# Patient Record
Sex: Male | Born: 1975 | Race: Black or African American | Hispanic: No | Marital: Single | Smoking: Never smoker
Health system: Southern US, Community
[De-identification: ages and names within clinical notes are randomized; demographics above are authoritative.]

---

## 2003-06-21 ENCOUNTER — Emergency Department (HOSPITAL_COMMUNITY): Admission: EM | Admit: 2003-06-21 | Discharge: 2003-06-21 | Payer: Self-pay | Admitting: Emergency Medicine

## 2007-02-15 ENCOUNTER — Encounter (INDEPENDENT_AMBULATORY_CARE_PROVIDER_SITE_OTHER): Payer: Self-pay | Admitting: *Deleted

## 2007-02-25 ENCOUNTER — Emergency Department (HOSPITAL_COMMUNITY): Admission: EM | Admit: 2007-02-25 | Discharge: 2007-02-25 | Payer: Self-pay | Admitting: Emergency Medicine

## 2007-02-27 ENCOUNTER — Emergency Department (HOSPITAL_COMMUNITY): Admission: EM | Admit: 2007-02-27 | Discharge: 2007-02-27 | Payer: Self-pay | Admitting: Emergency Medicine

## 2008-04-01 ENCOUNTER — Emergency Department (HOSPITAL_COMMUNITY): Admission: EM | Admit: 2008-04-01 | Discharge: 2008-04-01 | Payer: Self-pay | Admitting: Family Medicine

## 2010-04-25 ENCOUNTER — Telehealth: Payer: Self-pay | Admitting: Internal Medicine

## 2010-10-21 NOTE — Progress Notes (Signed)
Summary: Advised to go to ER  Phone Note Call from Patient   Summary of Call: Patient fiance called stating that the patient has been having high BPwith bystolic as high as 190. Pt has also c/o chest pain and limb/foot numbness. This began yesterday and the patient did not see a doctor, patient returned to work today stating that he felt only somewhat better. Fiance was advised to take patient to ER dept for cardiac work up. Initial call taken by: Lucious Groves CMA,  April 25, 2010 11:00 AM  Follow-up for Phone Call        agree Follow-up by: [facility] E. Paz MD,  April 25, 2010 3:30 PM

## 2010-12-13 ENCOUNTER — Emergency Department (INDEPENDENT_AMBULATORY_CARE_PROVIDER_SITE_OTHER): Payer: No Typology Code available for payment source

## 2010-12-13 ENCOUNTER — Emergency Department (HOSPITAL_BASED_OUTPATIENT_CLINIC_OR_DEPARTMENT_OTHER)
Admission: EM | Admit: 2010-12-13 | Discharge: 2010-12-13 | Disposition: A | Discharge status: Home / Self Care | Payer: No Typology Code available for payment source | Attending: Emergency Medicine | Admitting: Emergency Medicine

## 2010-12-13 DIAGNOSIS — M79609 Pain in unspecified limb: Secondary | ICD-10-CM

## 2010-12-13 DIAGNOSIS — S9030XA Contusion of unspecified foot, initial encounter: Secondary | ICD-10-CM | POA: Insufficient documentation

## 2010-12-13 DIAGNOSIS — Y92410 Unspecified street and highway as the place of occurrence of the external cause: Secondary | ICD-10-CM | POA: Insufficient documentation

## 2010-12-13 DIAGNOSIS — S139XXA Sprain of joints and ligaments of unspecified parts of neck, initial encounter: Secondary | ICD-10-CM | POA: Insufficient documentation

## 2010-12-13 DIAGNOSIS — M542 Cervicalgia: Secondary | ICD-10-CM

## 2010-12-16 ENCOUNTER — Emergency Department (INDEPENDENT_AMBULATORY_CARE_PROVIDER_SITE_OTHER): Payer: No Typology Code available for payment source

## 2010-12-16 ENCOUNTER — Emergency Department (HOSPITAL_BASED_OUTPATIENT_CLINIC_OR_DEPARTMENT_OTHER)
Admission: EM | Admit: 2010-12-16 | Discharge: 2010-12-16 | Disposition: A | Discharge status: Home / Self Care | Payer: No Typology Code available for payment source | Attending: Emergency Medicine | Admitting: Emergency Medicine

## 2010-12-16 DIAGNOSIS — M545 Low back pain, unspecified: Secondary | ICD-10-CM

## 2010-12-16 DIAGNOSIS — M542 Cervicalgia: Secondary | ICD-10-CM | POA: Insufficient documentation

## 2010-12-16 DIAGNOSIS — M546 Pain in thoracic spine: Secondary | ICD-10-CM | POA: Insufficient documentation

## 2010-12-16 DIAGNOSIS — S335XXA Sprain of ligaments of lumbar spine, initial encounter: Secondary | ICD-10-CM | POA: Insufficient documentation

## 2010-12-16 DIAGNOSIS — Y92410 Unspecified street and highway as the place of occurrence of the external cause: Secondary | ICD-10-CM | POA: Insufficient documentation

## 2010-12-23 ENCOUNTER — Emergency Department (HOSPITAL_BASED_OUTPATIENT_CLINIC_OR_DEPARTMENT_OTHER)
Admission: EM | Admit: 2010-12-23 | Discharge: 2010-12-23 | Disposition: A | Discharge status: Home / Self Care | Payer: No Typology Code available for payment source | Attending: Emergency Medicine | Admitting: Emergency Medicine

## 2010-12-23 DIAGNOSIS — S335XXA Sprain of ligaments of lumbar spine, initial encounter: Secondary | ICD-10-CM | POA: Insufficient documentation

## 2011-07-05 ENCOUNTER — Inpatient Hospital Stay (INDEPENDENT_AMBULATORY_CARE_PROVIDER_SITE_OTHER)
Admission: RE | Admit: 2011-07-05 | Discharge: 2011-07-05 | Disposition: A | Discharge status: Home / Self Care | Payer: Self-pay | Source: Ambulatory Visit | Attending: Family Medicine | Admitting: Family Medicine

## 2011-07-05 DIAGNOSIS — T6391XA Toxic effect of contact with unspecified venomous animal, accidental (unintentional), initial encounter: Secondary | ICD-10-CM

## 2011-07-09 LAB — CULTURE, ROUTINE-ABSCESS
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
## 2011-12-13 ENCOUNTER — Encounter (HOSPITAL_COMMUNITY): Payer: Self-pay

## 2011-12-13 ENCOUNTER — Emergency Department (HOSPITAL_COMMUNITY)
Admission: EM | Admit: 2011-12-13 | Discharge: 2011-12-13 | Disposition: A | Discharge status: Home / Self Care | Payer: 59 | Attending: Emergency Medicine | Admitting: Emergency Medicine

## 2011-12-13 DIAGNOSIS — R5381 Other malaise: Secondary | ICD-10-CM | POA: Insufficient documentation

## 2011-12-13 DIAGNOSIS — R5383 Other fatigue: Secondary | ICD-10-CM

## 2011-12-13 DIAGNOSIS — R509 Fever, unspecified: Secondary | ICD-10-CM | POA: Insufficient documentation

## 2011-12-13 NOTE — ED Provider Notes (Signed)
Medical screening examination/treatment/procedure(s) were performed by non-physician practitioner and as supervising physician I was immediately available for consultation/collaboration.  Jadie Allington T Anibal Quinby, MD 12/13/11 2325 

## 2011-12-13 NOTE — ED Notes (Signed)
Patient here with 3 days of low grade fever and no other symptoms. Patient denies cold symptoms+

## 2011-12-13 NOTE — Discharge Instructions (Signed)
Your symptoms may be due to possible viral infection. Make sure you get good rest, good sleep, drink plenty of fluids. Take tomorrow off work. Find a primary care doctor, follow up with them in 2-3 days. If worsening, if develop high fever, new symptoms, return here and we will recheck you.   Fatigue Fatigue is a feeling of tiredness, lack of energy, lack of motivation, or feeling tired all the time. Having enough rest, good nutrition, and reducing stress will normally reduce fatigue. Consult your caregiver if it persists. The nature of your fatigue will help your caregiver to find out its cause. The treatment is based on the cause.  CAUSES  There are many causes for fatigue. Most of the time, fatigue can be traced to one or more of your habits or routines. Most causes fit into one or more of three general areas. They are: Lifestyle problems  Sleep disturbances.   Overwork.   Physical exertion.   Unhealthy habits.   Poor eating habits or eating disorders.   Alcohol and/or drug use .   Lack of proper nutrition (malnutrition).  Psychological problems  Stress and/or anxiety problems.   Depression.   Grief.   Boredom.  Medical Problems or Conditions  Anemia.   Pregnancy.   Thyroid gland problems.   Recovery from major surgery.   Continuous pain.   Emphysema or asthma that is not well controlled   Allergic conditions.   Diabetes.   Infections (such as mononucleosis).   Obesity.   Sleep disorders, such as sleep apnea.   Heart failure or other heart-related problems.   Cancer.   Kidney disease.   Liver disease.   Effects of certain medicines such as antihistamines, cough and cold remedies, prescription pain medicines, heart and blood pressure medicines, drugs used for treatment of cancer, and some antidepressants.  SYMPTOMS  The symptoms of fatigue include:   Lack of energy.   Lack of drive (motivation).   Drowsiness.   Feeling of indifference to the  surroundings.  DIAGNOSIS  The details of how you feel help guide your caregiver in finding out what is causing the fatigue. You will be asked about your present and past health condition. It is important to review all medicines that you take, including prescription and non-prescription items. A thorough exam will be done. You will be questioned about your feelings, habits, and normal lifestyle. Your caregiver may suggest blood tests, urine tests, or other tests to look for common medical causes of fatigue.  TREATMENT  Fatigue is treated by correcting the underlying cause. For example, if you have continuous pain or depression, treating these causes will improve how you feel. Similarly, adjusting the dose of certain medicines will help in reducing fatigue.  HOME CARE INSTRUCTIONS   Try to get the required amount of good sleep every night.   Eat a healthy and nutritious diet, and drink enough water throughout the day.   Practice ways of relaxing (including yoga or meditation).   Exercise regularly.   Make plans to change situations that cause stress. Act on those plans so that stresses decrease over time. Keep your work and personal routine reasonable.   Avoid street drugs and minimize use of alcohol.   Start taking a daily multivitamin after consulting your caregiver.  SEEK MEDICAL CARE IF:   You have persistent tiredness, which cannot be accounted for.   You have fever.   You have unintentional weight loss.   You have headaches.   You have disturbed  sleep throughout the night.   You are feeling sad.   You have constipation.   You have dry skin.   You have gained weight.   You are taking any new or different medicines that you suspect are causing fatigue.   You are unable to sleep at night.   You develop any unusual swelling of your legs or other parts of your body.  SEEK IMMEDIATE MEDICAL CARE IF:   You are feeling confused.   Your vision is blurred.   You feel  faint or pass out.   You develop severe headache.   You develop severe abdominal, pelvic, or back pain.   You develop chest pain, shortness of breath, or an irregular or fast heartbeat.   You are unable to pass a normal amount of urine.   You develop abnormal bleeding such as bleeding from the rectum or you vomit blood.   You have thoughts about harming yourself or committing suicide.   You are worried that you might harm someone else.  MAKE SURE YOU:   Understand these instructions.   Will watch your condition.   Will get help right away if you are not doing well or get worse.  Document Released: 07/05/2007 Document Revised: 08/27/2011 Document Reviewed: 07/05/2007 [facility] Patient Information 2012 [place], [place].

## 2011-12-13 NOTE — ED Provider Notes (Signed)
History     CSN: [id number]  Arrival date & time 12/13/11  1543   First MD Initiated Contact with Patient 12/13/11 1712      Chief Complaint  Patient presents with  . Fever    (Consider location/radiation/quality/duration/timing/severity/associated sxs/prior treatment) Patient is a 36 y.o. male presenting with fever. The history is provided by the patient.  Fever Primary symptoms of the febrile illness include fever and fatigue. Primary symptoms do not include headaches, cough, wheezing, shortness of breath, abdominal pain, nausea, vomiting, diarrhea, dysuria, myalgias, arthralgias or rash. The current episode started 2 days ago. This is a new problem. The problem has not changed since onset. Pt states he has just been feeling weak and tired for last two days. States he has had a "fever" up to 99 at home. States he has no appetite. No other symptoms. He denies sore throat, ear pain, congestion, cough, abdominal pain, nausea, vomiting, diarrhea, urinary symptoms. No swelling of extremities. No rash. No headache. Has not been taking any medications.    History reviewed. No pertinent past medical history.  History reviewed. No pertinent past surgical history.  No family history on file.  History  Substance Use Topics  . Smoking status: Not on file  . Smokeless tobacco: Not on file  . Alcohol Use: Not on file      Review of Systems  Constitutional: Positive for fever and fatigue.  HENT: Negative for ear pain, congestion, sore throat, facial swelling, neck pain, neck stiffness, dental problem and ear discharge.   Eyes: Negative.   Respiratory: Negative for cough, shortness of breath and wheezing.   Gastrointestinal: Negative for nausea, vomiting, abdominal pain and diarrhea.  Genitourinary: Negative for dysuria, urgency, frequency and flank pain.  Musculoskeletal: Negative for myalgias and arthralgias.  Skin: Negative for rash.  Neurological: Negative for headaches.    Psychiatric/Behavioral: Negative.     Allergies  Review of patient's allergies indicates not on file.  Home Medications  No current outpatient prescriptions on file.  BP 141/94  Pulse 104  Temp(Src) 99.1 F (37.3 C) (Oral)  Resp 16  SpO2 98%  Physical Exam  Nursing note and vitals reviewed. Constitutional: He is oriented to person, place, and time. He appears well-developed and well-nourished.  HENT:  Head: Normocephalic and atraumatic.  Right Ear: Tympanic membrane, external ear and ear canal normal.  Left Ear: Tympanic membrane, external ear and ear canal normal.  Nose: Nose normal.  Mouth/Throat: Uvula is midline, oropharynx is clear and moist and mucous membranes are normal. No oropharyngeal exudate, posterior oropharyngeal edema, posterior oropharyngeal erythema or tonsillar abscesses.  Eyes: Conjunctivae and EOM are normal. Pupils are equal, round, and reactive to light.  Neck: Normal range of motion. Neck supple.  Cardiovascular: Normal rate, regular rhythm and normal heart sounds.   Pulmonary/Chest: Effort normal and breath sounds normal. No respiratory distress. He has no wheezes. He has no rales.  Abdominal: Soft. Bowel sounds are normal. He exhibits no distension. There is no tenderness.  Musculoskeletal: Normal range of motion. He exhibits no edema.  Lymphadenopathy:    He has no cervical adenopathy.  Neurological: He is alert and oriented to person, place, and time.  Skin: Skin is warm. No rash noted. No erythema.  Psychiatric: He has a normal mood and affect.    ED Course  Procedures (including critical care time)  Pt is currently afebrile. Fever at home only to 99. Do not suspect real fever. Exam normal today. Discussed with pt. Will try  at home rest, fluids. If symptoms worsen or develop any new symptoms, instructed to return to ER or to be rechecked by PCP. Right now do not think any blood tests or x-rays are necessary, since no symptoms other then  generalized fatigue for 2 days. Pt also has no medical problems. Possible onset of a vrial illness.   No diagnosis found.    MDM          Lottie Mussel, PA 12/13/11 [phone]

## 2015-02-06 ENCOUNTER — Emergency Department (HOSPITAL_BASED_OUTPATIENT_CLINIC_OR_DEPARTMENT_OTHER): Payer: 59

## 2015-02-06 ENCOUNTER — Emergency Department (HOSPITAL_BASED_OUTPATIENT_CLINIC_OR_DEPARTMENT_OTHER)
Admission: EM | Admit: 2015-02-06 | Discharge: 2015-02-06 | Disposition: A | Discharge status: Home / Self Care | Payer: 59 | Attending: Emergency Medicine | Admitting: Emergency Medicine

## 2015-02-06 ENCOUNTER — Encounter (HOSPITAL_BASED_OUTPATIENT_CLINIC_OR_DEPARTMENT_OTHER): Payer: Self-pay

## 2015-02-06 DIAGNOSIS — S299XXA Unspecified injury of thorax, initial encounter: Secondary | ICD-10-CM | POA: Diagnosis present

## 2015-02-06 DIAGNOSIS — Y92410 Unspecified street and highway as the place of occurrence of the external cause: Secondary | ICD-10-CM | POA: Diagnosis not present

## 2015-02-06 DIAGNOSIS — Z880 Allergy status to penicillin: Secondary | ICD-10-CM | POA: Diagnosis not present

## 2015-02-06 DIAGNOSIS — S298XXA Other specified injuries of thorax, initial encounter: Secondary | ICD-10-CM

## 2015-02-06 DIAGNOSIS — Y998 Other external cause status: Secondary | ICD-10-CM | POA: Insufficient documentation

## 2015-02-06 DIAGNOSIS — Y9389 Activity, other specified: Secondary | ICD-10-CM | POA: Diagnosis not present

## 2015-02-06 MED ORDER — NAPROXEN SODIUM 550 MG PO TABS: ORAL_TABLET | ORAL | Status: DC

## 2015-02-06 MED ORDER — NAPROXEN 250 MG PO TABS
500.0000 mg | ORAL_TABLET | Freq: Once | ORAL | Status: AC
  Administered 2015-02-06: 500 mg via ORAL
  Filled 2015-02-06: qty 2
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
## 2015-11-25 ENCOUNTER — Encounter (HOSPITAL_BASED_OUTPATIENT_CLINIC_OR_DEPARTMENT_OTHER): Payer: Self-pay | Admitting: Emergency Medicine

## 2015-11-25 ENCOUNTER — Emergency Department (HOSPITAL_BASED_OUTPATIENT_CLINIC_OR_DEPARTMENT_OTHER)
Admission: EM | Admit: 2015-11-25 | Discharge: 2015-11-25 | Disposition: A | Discharge status: Home / Self Care | Payer: 59 | Attending: Emergency Medicine | Admitting: Emergency Medicine

## 2015-11-25 DIAGNOSIS — Y9389 Activity, other specified: Secondary | ICD-10-CM | POA: Diagnosis not present

## 2015-11-25 DIAGNOSIS — Y998 Other external cause status: Secondary | ICD-10-CM | POA: Insufficient documentation

## 2015-11-25 DIAGNOSIS — Y9289 Other specified places as the place of occurrence of the external cause: Secondary | ICD-10-CM | POA: Diagnosis not present

## 2015-11-25 DIAGNOSIS — M7989 Other specified soft tissue disorders: Secondary | ICD-10-CM | POA: Diagnosis present

## 2015-11-25 DIAGNOSIS — T7840XA Allergy, unspecified, initial encounter: Secondary | ICD-10-CM

## 2015-11-25 DIAGNOSIS — L539 Erythematous condition, unspecified: Secondary | ICD-10-CM | POA: Diagnosis not present

## 2015-11-25 DIAGNOSIS — Z880 Allergy status to penicillin: Secondary | ICD-10-CM | POA: Insufficient documentation

## 2015-11-25 DIAGNOSIS — X58XXXA Exposure to other specified factors, initial encounter: Secondary | ICD-10-CM | POA: Insufficient documentation

## 2015-11-25 MED ORDER — FAMOTIDINE IN NACL 20-0.9 MG/50ML-% IV SOLN
20.0000 mg | Freq: Once | INTRAVENOUS | Status: DC
  Filled 2015-11-25: qty 50

## 2015-11-25 MED ORDER — DIPHENHYDRAMINE HCL 50 MG/ML IJ SOLN
25.0000 mg | Freq: Once | INTRAMUSCULAR | Status: DC
  Filled 2015-11-25: qty 1

## 2015-11-25 MED ORDER — FAMOTIDINE 20 MG PO TABS
20.0000 mg | ORAL_TABLET | Freq: Once | ORAL | Status: AC
  Administered 2015-11-25: 20 mg via ORAL
  Filled 2015-11-25: qty 1

## 2015-11-25 MED ORDER — CEPHALEXIN 500 MG PO CAPS: 500.0000 mg | ORAL_CAPSULE | Freq: Four times a day (QID) | ORAL | Status: AC

## 2015-11-25 MED ORDER — DIPHENHYDRAMINE HCL 25 MG PO CAPS
25.0000 mg | ORAL_CAPSULE | Freq: Once | ORAL | Status: AC
  Administered 2015-11-25: 25 mg via ORAL
  Filled 2015-11-25: qty 1

## 2015-11-25 MED FILL — CEPHALEXIN 500 MG CAPSULE: 500 | 10 days supply | Qty: 40 | Fill #0

## 2015-11-25 NOTE — ED Provider Notes (Signed)
CSN: [id number]     Arrival date & time 11/25/15  0907 History   First MD Initiated Contact with Patient 11/25/15 [phone]     Chief Complaint  Patient presents with  . Arm Swelling    HPI  40 year old male presenting for right arm redness and itching 3 days. Awoke on 3/4 with a itchy, red bump on his right wrist. Throughout the day and into yesterday his wrist started to swell and he developed a red, itchy tract extending from the initial bump down his left forearm. As his wrist started to swell, his pinky finger developed tingling but not weakness He denies SOB, N/V/D, chest pain, weakness Otherwise he denies recent illness, fever, rash anywhere else, no similar bumps or rashes on anyone else in the house  PMH: none PSH: none  Social History  Substance Use Topics  . Smoking status: Never Smoker   . Smokeless tobacco: None  . Alcohol Use: Yes    Review of Systems  Constitutional: Negative.   HENT: Negative.   Eyes: Negative.   Respiratory: Negative.   Cardiovascular: Negative.   Gastrointestinal: Negative.   Endocrine: Negative.   Genitourinary: Negative.   Musculoskeletal: Negative.   Skin: Negative.       Allergies  Penicillins  Home Medications   Prior to Admission medications   Medication Sig Start Date End Date Taking? Authorizing Provider  cephALEXin (KEFLEX) 500 MG capsule Take 1 capsule (500 mg total) by mouth 4 (four) times daily. 11/25/15   Oley Lahaie A Tasnim Balentine, MD   BP 153/98 mmHg  Pulse 92  Temp(Src) 99.1 F (37.3 C) (Oral)  Resp 16  Ht 5\' 11"  (1.803 m)  Wt 90.719 kg  BMI 27.91 kg/m2  SpO2 100% Physical Exam  Constitutional: He appears well-developed and well-nourished.  HENT:  Head: Normocephalic and atraumatic.  Eyes: Conjunctivae and EOM are normal. Pupils are equal, round, and reactive to light.  Cardiovascular: Normal rate, regular rhythm and normal heart sounds.   No murmur heard. Pulmonary/Chest: Effort normal and breath sounds normal. No  respiratory distress.  Abdominal: Soft. Bowel sounds are normal. He exhibits no distension. There is no tenderness.  Musculoskeletal: He exhibits edema.  + Right wrist swelling, erythema, no warmth or tenderness, normal ROM of bilateral wrists and digits, 5/5 hand strength bilaterally   ED Course  Procedures (including critical care time) Labs Review Labs Reviewed - No data to display  Imaging Review No results found. I have personally reviewed and evaluated these images and lab results as part of my medical decision-making.   EKG Interpretation None      MDM   40 year old male presenting likely allergic reaction to insect bite vs possible cellulitis. Benadryl IV 25 mg 1 and Pepcid 20 mg IV 1 in the emergency room. Given no systemic symptoms like shortness of breath or GI symptoms, after improvement of symptoms treatment patient was stable to be discharged to home. Although concern infection possible, history and physical exam makes active infection of joint or soft tissue less likely, will treat with Keflex x10 days to cover for any possible skin infection. Patient counseled to follow closely primary care provider. Strong return precautions were discussed.  Lash Matulich A. Kennon RoundsHaney MD, MS Family Medicine Resident PGY-2 Pager [phone]      Bonney AidAlyssa A Candela Krul, MD 11/25/15 1106  Glynn OctaveStephen Rancour, MD 11/25/15 [phone]

## 2015-11-25 NOTE — Discharge Instructions (Signed)
Your treated in the emergency room for likely allergic reaction. If you develop shortness of breath, swelling, diffuse rash, nausea, vomiting, diarrhea return to the emergency room immediately for evaluation. Please follow-up with primary care provider.

## 2015-11-25 NOTE — ED Provider Notes (Signed)
By signing my name below, I, Sonum Patel, attest that this documentation has been prepared under the direction and in the presence of Glynn OctaveStephen Damarcus Reggio, MD. Electronically Signed: Sonum Patel, Neurosurgeoncribe. 11/25/2015. 9:44 AM.   I saw and evaluated the patient, reviewed the resident's note and I agree with the findings and plan. If applicable, I agree with the resident's interpretation of the EKG.  If applicable, I was present for critical portions of any procedures performed.  HPI Comments: Aaron McmurrayByron T Cline is a 40 y.o. male who presents to the Emergency Department complaining of 2 days of gradual onset, constant, gradually worsened pustule to the right wrist and forearm with associated itching, redness and swelling that began yesterday. He believes something bit him but is unsure of what. He states he was near a cat but states he is unaware of any allergy. He denies fever, vomiting, SOB.   Small papule to dorsal right ulnar wrist with surrounding erythema with faint erythema extending up volar forearm. Full ROM of wrist. Intact radial pulse and cardinal hand movements.   Suspect allergic reaction, less likely infection.  Treat with antihistamines. No evidence of septic joint. Possibly early overlying cellulitis.  I personally performed the services described in this documentation, which was scribed in my presence. The recorded information has been reviewed and is accurate.   Glynn OctaveStephen Torrance Stockley, MD 11/25/15 1630
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
